# Patient Record
Sex: Female | Born: 1950 | Hispanic: No | Marital: Married | Smoking: Former smoker
Health system: Southern US, Community
[De-identification: ages and names within clinical notes are randomized; demographics above are authoritative.]

## PROBLEM LIST (undated history)

## (undated) DIAGNOSIS — T7840XA Allergy, unspecified, initial encounter: Secondary | ICD-10-CM

## (undated) DIAGNOSIS — B019 Varicella without complication: Secondary | ICD-10-CM

## (undated) DIAGNOSIS — M1990 Unspecified osteoarthritis, unspecified site: Secondary | ICD-10-CM

## (undated) DIAGNOSIS — G43909 Migraine, unspecified, not intractable, without status migrainosus: Secondary | ICD-10-CM

## (undated) HISTORY — DX: Varicella without complication: B01.9

## (undated) HISTORY — DX: Migraine, unspecified, not intractable, without status migrainosus: G43.909

## (undated) HISTORY — DX: Unspecified osteoarthritis, unspecified site: M19.90

## (undated) HISTORY — PX: APPENDECTOMY: SHX54

## (undated) HISTORY — DX: Allergy, unspecified, initial encounter: T78.40XA

---

## 2009-03-12 ENCOUNTER — Ambulatory Visit: Payer: Self-pay | Admitting: Internal Medicine

## 2009-03-26 ENCOUNTER — Ambulatory Visit: Payer: Self-pay | Admitting: Internal Medicine

## 2015-03-03 ENCOUNTER — Encounter: Payer: Self-pay | Admitting: Internal Medicine

## 2018-03-27 NOTE — Progress Notes (Signed)
Amanda Cortez D.O. [facility] [address] [place], [place] [address] Phone: [phone] Subjective:    I'm seeing this patient by the request  of:  Martha ClanShaw, William, MD   CC: Left knee and right shoulder pain  BJY:NWGNFAOZHYHPI:Subjective  Amanda Cortez is a [age] y.o. female coming in with complaint of left knee pain since 1991. She had an incident when she was playing softball and her knee buckled. Was in an immobolizer for 1 week. Continues to have problems with declines. Notes popping in the knee. Knee can lock on her on occasion. Was told that she does not have a patellar tendon reflex per her PCP denies any true swelling at this time.  States only going downhill can sometimes give worsening discomfort.  Denies any significant instability.   She also complains of rightshoulderpain. Patient feel off of a truck in 1995 and landed on her elbow on the sidewalk. States that she dislocated her clavicle. Non specific movements both her. She does Jazzercise and has had to drop weight from 5 to 2#. Pain with horizontal abduction. Most of her pain is in front of shoulder.  Patient states mild overall but does continue to give her difficulty with lifting greater than 5 pounds     Past Medical History:  Diagnosis Date  . Allergy   . Arthritis   . Chicken pox   . Migraines    Past Surgical History:  Procedure Laterality Date  . APPENDECTOMY    . CESAREAN SECTION     Social History   Socioeconomic History  . Marital status: Married    Spouse name: Not on file  . Number of children: Not on file  . Years of education: Not on file  . Highest education level: Not on file  Occupational History  . Not on file  Social Needs  . Financial resource strain: Not on file  . Food insecurity:    Worry: Not on file    Inability: Not on file  . Transportation needs:    Medical: Not on file    Non-medical: Not on file  Tobacco Use  . Smoking status: Not on file  Substance and Sexual Activity    . Alcohol use: Not on file  . Drug use: Not on file  . Sexual activity: Not on file  Lifestyle  . Physical activity:    Days per week: Not on file    Minutes per session: Not on file  . Stress: Not on file  Relationships  . Social connections:    Talks on phone: Not on file    Gets together: Not on file    Attends religious service: Not on file    Active member of club or organization: Not on file    Attends meetings of clubs or organizations: Not on file    Relationship status: Not on file  Other Topics Concern  . Not on file  Social History Narrative  . Not on file   Allergies  Allergen Reactions  . Codeine     REACTION: nausea  . Meperidine Hcl     REACTION: nausea   Family History  Problem Relation Age of Onset  . Arthritis Mother   . Hearing loss Mother   . Miscarriages / [place] Mother   . Migraines Mother   . Stroke Mother   . Hearing loss Father   . Emphysema Father   . Diabetes Maternal Grandmother   . Arthritis Maternal Grandmother   . Hypertension  Maternal Grandfather   . Arthritis Paternal Grandfather      Past medical history, social, surgical and family history all reviewed in electronic medical record.  No pertanent information unless stated regarding to the chief complaint.   Review of Systems:Review of systems updated and as accurate as of 03/28/18  No headache, visual changes, nausea, vomiting, diarrhea, constipation, dizziness, abdominal pain, skin rash, fevers, chills, night sweats, weight loss, swollen lymph nodes, body aches, joint swelling, muscle aches, chest pain, shortness of breath, mood changes.   Objective  Blood pressure 106/72, pulse 65, height 4\' 11"  (1.499 m), weight 180 lb (81.6 kg), SpO2 98 %. Systems examined below as of 03/28/18   General: No apparent distress alert and oriented x3 mood and affect normal, dressed appropriately.  HEENT: Pupils equal, extraocular movements intact  Respiratory: Patient's speak in full  sentences and does not appear short of breath  Cardiovascular: Trace lower extremity edema, non tender, no erythema  Skin: Warm dry intact with no signs of infection or rash on extremities or on axial skeleton.  Abdomen: Soft nontender  Neuro: Cranial nerves II through XII are intact, neurovascularly intact in all extremities with 2+ DTRs and 2+ pulses.  Lymph: No lymphadenopathy of posterior or anterior cervical chain or axillae bilaterally.  Gait antalgic MSK:  Non tender with full range of motion and good stability and symmetric strength and tone of  elbows, wrist, hip, and ankles bilaterally.  Shoulder: Right Inspection reveals no abnormalities, atrophy or asymmetry. The acromioclavicular joint ROM is full in all planes. Rotator cuff strength normal throughout. No signs of impingement with negative Neer and Hawkin's tests, empty can sign. Speeds and Yergason's tests normal. No labral pathology noted with negative Obrien's, negative clunk and good stability.  Positive crossover Normal scapular function observed. No painful arc and no drop arm sign. No apprehension sign  Knee: Left  Large thigh to calf ratio.  Tender to palpation over medial and PF joint line.  ROM full in flexion and extension and lower leg rotation. instability with valgus force.  No gapping in the MCL noted painful patellar compression. Patellar glide with moderate crepitus. Patellar and quadriceps tendons unremarkable. Hamstring and quadriceps strength is normal. Contralateral knee shows minimal arthritic changes  MSK US performed of: Left knee This study was ordered, performed, and interpreted by Terrilee Files D.O.  Knee: All structures visualized. Narrowing noted of the medial joint line and patient does have an MCL rupture noted that seems to be old.  LCL intact.  Lateral meniscus degenerative tearing with mild peri-meniscal cyst Patellar Tendon unremarkable on long and transverse views without  effusion.  IMPRESSION: Mild tricompartmental arthritis, MCL tear  [address]; 15 additional minutes spent for Therapeutic exercises as stated in above notes.  This included exercises focusing on stretching, strengthening, with significant focus on eccentric aspects.   Long term goals include an improvement in range of motion, strength, endurance as well as avoiding reinjury. Patient's frequency would include in 1-2 times a day, 3-5 times a week for a duration of 6-12 weeks. Shoulder Exercises that included:  Basic scapular stabilization to include adduction and depression of scapula Scaption, focusing on proper movement and good control Internal and External rotation utilizing a theraband, with elbow tucked at side entire time Rows with theraband   Proper technique shown and discussed handout in great detail with ATC.  All questions were discussed and answered.     Impression and Recommendations:     This case required medical decision  making of moderate complexity.      Note: This dictation was prepared with Dragon dictation along with smaller phrase technology. Any transcriptional errors that result from this process are unintentional.

## 2018-03-28 ENCOUNTER — Ambulatory Visit: Payer: Self-pay

## 2018-03-28 ENCOUNTER — Ambulatory Visit: Payer: Federal, State, Local not specified - PPO | Admitting: Family Medicine

## 2018-03-28 ENCOUNTER — Encounter: Payer: Self-pay | Admitting: Family Medicine

## 2018-03-28 ENCOUNTER — Ambulatory Visit (INDEPENDENT_AMBULATORY_CARE_PROVIDER_SITE_OTHER)
Admission: RE | Admit: 2018-03-28 | Discharge: 2018-03-28 | Disposition: A | Discharge status: Home / Self Care | Payer: Federal, State, Local not specified - PPO | Source: Ambulatory Visit | Attending: Family Medicine | Admitting: Family Medicine

## 2018-03-28 VITALS — BP 106/72 | HR 65 | Ht 59.0 in | Wt 180.0 lb

## 2018-03-28 DIAGNOSIS — S83412A Sprain of medial collateral ligament of left knee, initial encounter: Secondary | ICD-10-CM

## 2018-03-28 DIAGNOSIS — G8929 Other chronic pain: Secondary | ICD-10-CM | POA: Diagnosis not present

## 2018-03-28 DIAGNOSIS — M25511 Pain in right shoulder: Secondary | ICD-10-CM | POA: Diagnosis not present

## 2018-03-28 DIAGNOSIS — M25562 Pain in left knee: Secondary | ICD-10-CM

## 2018-03-28 DIAGNOSIS — M19011 Primary osteoarthritis, right shoulder: Secondary | ICD-10-CM | POA: Diagnosis not present

## 2018-03-28 NOTE — Patient Instructions (Signed)
Good to see you  [name] is yoru friend.  Keep hands within peripheral vision  Exercises 3 times a week.   Alternate the knee and the shoulder Try the brace on the knee with a lot of activity  Continue the vitamin Dand the tart cherry when you can  See me again In 4 weeks

## 2018-03-28 NOTE — Assessment & Plan Note (Signed)
Chronic.  Seems to be years old.  X-rays ordered, icing regimen, bracing, patient wants to avoid surgical intervention.  Patient's ACL appears to be intact.  Follow-up again in 4 weeks

## 2018-03-28 NOTE — Assessment & Plan Note (Signed)
Arthritis.  History of clavicle fracture.  Discussed home exercise, proper lifting technique, which activities of doing which wants to avoid.  Follow-up again in 4 weeks

## 2018-04-03 ENCOUNTER — Encounter: Payer: Self-pay | Admitting: Family Medicine

## 2018-04-27 ENCOUNTER — Ambulatory Visit: Payer: Federal, State, Local not specified - PPO | Admitting: Family Medicine

## 2018-05-21 NOTE — Progress Notes (Signed)
[facility] [address] [place], [place] [address] Phone: [phone] Subjective:   Bruce Donath, am serving as a scribe for Dr. Antoine Primas.  CC: Knee pain  LKG:[id number]  Amanda Cortez is a 67 y.o. female coming in with complaint of knee pain. Is doing better. Does have locking sensation when going down stairs. Is able to do child's pose now. Is not using a brace. Sitting for prolonged periods also causes an increase in pain.  Overall doing relatively well not stopping her from activity.  Her right shoulder is still bothering her. Pain is intermittent. She did do some exercises yesterday. No pain yesterday but is in pain today. Feels better with movement. Denies any numbness or tingling.       Past Medical History:  Diagnosis Date  . Allergy   . Arthritis   . Chicken pox   . Migraines    Past Surgical History:  Procedure Laterality Date  . APPENDECTOMY    . CESAREAN SECTION     Social History   Socioeconomic History  . Marital status: Married    Spouse name: Not on file  . Number of children: Not on file  . Years of education: Not on file  . Highest education level: Not on file  Occupational History  . Not on file  Social Needs  . Financial resource strain: Not on file  . Food insecurity:    Worry: Not on file    Inability: Not on file  . Transportation needs:    Medical: Not on file    Non-medical: Not on file  Tobacco Use  . Smoking status: Not on file  Substance and Sexual Activity  . Alcohol use: Not on file  . Drug use: Not on file  . Sexual activity: Not on file  Lifestyle  . Physical activity:    Days per week: Not on file    Minutes per session: Not on file  . Stress: Not on file  Relationships  . Social connections:    Talks on phone: Not on file    Gets together: Not on file    Attends religious service: Not on file    Active member of club or organization: Not on file    Attends meetings of clubs or  organizations: Not on file    Relationship status: Not on file  Other Topics Concern  . Not on file  Social History Narrative  . Not on file   Allergies  Allergen Reactions  . Codeine     REACTION: nausea  . Meperidine Hcl     REACTION: nausea   Family History  Problem Relation Age of Onset  . Arthritis Mother   . Hearing loss Mother   . Miscarriages / [place] Mother   . Migraines Mother   . Stroke Mother   . Hearing loss Father   . Emphysema Father   . Diabetes Maternal Grandmother   . Arthritis Maternal Grandmother   . Hypertension Maternal Grandfather   . Arthritis Paternal Grandfather    No current outpatient medications on file.    Past medical history, social, surgical and family history all reviewed in electronic medical record.  No pertanent information unless stated regarding to the chief complaint.   Review of Systems:  No headache, visual changes, nausea, vomiting, diarrhea, constipation, dizziness, abdominal pain, skin rash, fevers, chills, night sweats, weight loss, swollen lymph nodes, body aches, joint swelling, muscle aches, chest pain, shortness of breath,  mood changes.   Objective  Blood pressure 100/68, pulse 71, height 4\' 11"  (1.499 m), weight 179 lb (81.2 kg), SpO2 98 %.    General: No apparent distress alert and oriented x3 mood and affect normal, dressed appropriately.  HEENT: Pupils equal, extraocular movements intact  Respiratory: Patient's speak in full sentences and does not appear short of breath  Cardiovascular: No lower extremity edema, non tender, no erythema  Skin: Warm dry intact with no signs of infection or rash on extremities or on axial skeleton.  Abdomen: Soft nontender  Neuro: Cranial nerves II through XII are intact, neurovascularly intact in all extremities with 2+ DTRs and 2+ pulses.  Lymph: No lymphadenopathy of posterior or anterior cervical chain or axillae bilaterally.  Gait normal with good balance and coordination.    MSK:  Non tender with full range of motion and good stability and symmetric strength and tone of , elbows, wrist, hip, and ankles bilaterally.  Right shoulder exam still shows a positive crossover.  \Rotator cuff strength is intact.  Patient has some mild impingement noted.  Negative Spurling's noted.  Grip strength full and symmetric\  Left knee exam shows the patient does have good stability.  Near full range of motion.  Mild crepitus around the patella.  Mild lateral tracking noted    Impression and Recommendations:     This case required medical decision making of moderate complexity. The above documentation has been reviewed and is accurate and complete Judi Saa, DO       Note: This dictation was prepared with Dragon dictation along with smaller phrase technology. Any transcriptional errors that result from this process are unintentional.

## 2018-05-22 ENCOUNTER — Ambulatory Visit: Payer: Federal, State, Local not specified - PPO | Admitting: Family Medicine

## 2018-05-22 ENCOUNTER — Encounter: Payer: Self-pay | Admitting: Family Medicine

## 2018-05-22 DIAGNOSIS — S83412A Sprain of medial collateral ligament of left knee, initial encounter: Secondary | ICD-10-CM

## 2018-05-22 DIAGNOSIS — M1710 Unilateral primary osteoarthritis, unspecified knee: Secondary | ICD-10-CM

## 2018-05-22 DIAGNOSIS — M19011 Primary osteoarthritis, right shoulder: Secondary | ICD-10-CM | POA: Diagnosis not present

## 2018-05-22 NOTE — Assessment & Plan Note (Signed)
Patellofemoral Syndrome  Reviewed anatomy using anatomical model and how PFS occurs.  Given rehab exercises handout for VMO, hip abductors, core, entire kinetic chain including proprioception exercises including cone touches, step downs, hip elevations and turn outs.  Could benefit from PT, regular exercise, upright biking, and a PFS knee brace to assist with tracking abnormalities. RTC in 5-6 weeks

## 2018-05-22 NOTE — Assessment & Plan Note (Signed)
Significant improvement.  Patient does have a good endpoint.  I believe the patient will heal appropriately.  Patient was to do physical therapy but has declined.  We will continue with conservative therapy.  Her underlying patellofemoral arthritis likely contributing to some of the discomfort and pain still.  New exercises given.  Follow-up in 4 weeks

## 2018-05-22 NOTE — Assessment & Plan Note (Signed)
Stable.  Discussed icing regimen and home exercises.  Discussed which activities to do which wants to avoid.  Increase activity as tolerated.  Discussed different ergonomic changes.  Follow-up in 4 weeks

## 2018-05-22 NOTE — Patient Instructions (Signed)
Good to see you  Ice is yoru friend Stay active Keep hands within peripheral vison  For the knee incorporate biking and strengthen the inside muscle of the quad.  [employer] "total support" online would be great  Do not lace middle eye of the shoe See me again in 6-8 weeks

## 2018-07-13 ENCOUNTER — Ambulatory Visit: Payer: Federal, State, Local not specified - PPO | Admitting: Family Medicine

## 2018-07-13 ENCOUNTER — Encounter: Payer: Self-pay | Admitting: Family Medicine

## 2018-07-13 DIAGNOSIS — M1710 Unilateral primary osteoarthritis, unspecified knee: Secondary | ICD-10-CM

## 2018-07-13 DIAGNOSIS — M19011 Primary osteoarthritis, right shoulder: Secondary | ICD-10-CM

## 2018-07-13 NOTE — Assessment & Plan Note (Signed)
Much better at this time.  No change in management.  Follow-up as needed

## 2018-07-13 NOTE — Assessment & Plan Note (Signed)
Also having improvement.  Discussed icing regimen and home exercise.  Discussed keeping hands within the peripheral vision.  Worsening symptoms consider injection.  Patient will follow-up as needed

## 2018-07-13 NOTE — Progress Notes (Signed)
Amanda Cortez D.O. [facility] [address] [place], [name] Subjective:    I Amanda NighKana Cortez am serving as a Neurosurgeonscribe for Dr. Antoine PrimasZachary Elohim Cortez.   I'm seeing this patient by the request  of:    CC: Left knee pain, right shoulder pain follow-up  BJY:NWGNFAOZHYHPI:Subjective  Amanda Cortez is a 67 y.o. female coming in with complaint of left knee pain. States the knee is feeling much better. Shoulder as well.  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  Patient states that only crossing over her body gives her some discomfort and pain.  Sometimes sleeping on it is uncomfortable but does not keep her up at night.  Patient denies any radiation of the arm or any numbness or tingling.     Past Medical History:  Diagnosis Date  . Allergy   . Arthritis   . Chicken pox   . Migraines    Past Surgical History:  Procedure Laterality Date  . APPENDECTOMY    . CESAREAN SECTION     Social History   Socioeconomic History  . Marital status: Married    Spouse name: Not on file  . Number of children: Not on file  . Years of education: Not on file  . Highest education level: Not on file  Occupational History  . Not on file  Social Needs  . Financial resource strain: Not on file  . Food insecurity:    Worry: Not on file    Inability: Not on file  . Transportation needs:    Medical: Not on file    Non-medical: Not on file  Tobacco Use  . Smoking status: Former Smoker    Last attempt to quit: 08/24/2011    Years since quitting: 6.8  . Smokeless tobacco: Never Used  Substance and Sexual Activity  . Alcohol use: Not on file  . Drug use: Not on file  . Sexual activity: Not on file  Lifestyle  . Physical activity:    Days per week: Not on file    Minutes per session: Not on file  . Stress: Not on file  Relationships  . Social connections:    Talks on phone: Not on file    Gets together: Not on file    Attends religious  service: Not on file    Active member of club or organization: Not on file    Attends meetings of clubs or organizations: Not on file    Relationship status: Not on file  Other Topics Concern  . Not on file  Social History Narrative  . Not on file   Allergies  Allergen Reactions  . Codeine     REACTION: nausea  . Meperidine Hcl     REACTION: nausea   Family History  Problem Relation Age of Onset  . Arthritis Mother   . Hearing loss Mother   . Miscarriages / [place] Mother   . Migraines Mother   . Stroke Mother   . Hearing loss Father   . Emphysema Father   . Diabetes Maternal Grandmother   . Arthritis Maternal Grandmother   . Hypertension Maternal Grandfather   . Arthritis Paternal Grandfather          Current Outpatient Medications (Other):  Marland Kitchen.  Astaxanthin 5 MG CAPS, Take by mouth. 12mg  1 time a day    Past medical history, social, surgical and family history all reviewed in electronic medical record.  No pertanent information unless stated regarding  to the chief complaint.   Review of Systems:  No headache, visual changes, nausea, vomiting, diarrhea, constipation, dizziness, abdominal pain, skin rash, fevers, chills, night sweats, weight loss, swollen lymph nodes, body aches, joint swelling, muscle aches, chest pain, shortness of breath, mood changes.   Objective  Blood pressure 130/70, pulse 67, height 4\' 11"  (1.499 m), weight 177 lb (80.3 kg), SpO2 97 %. Systems examined below as of    General: No apparent distress alert and oriented x3 mood and affect normal, dressed appropriately.  HEENT: Pupils equal, extraocular movements intact  Respiratory: Patient's speak in full sentences and does not appear short of breath  Cardiovascular: No lower extremity edema, non tender, no erythema  Skin: Warm dry intact with no signs of infection or rash on extremities or on axial skeleton.  Abdomen: Soft nontender  Neuro: Cranial nerves II through XII are intact,  neurovascularly intact in all extremities with 2+ DTRs and 2+ pulses.  Lymph: No lymphadenopathy of posterior or anterior cervical chain or axillae bilaterally.  Gait normal with good balance and coordination.  MSK:  Non tender with full range of motion and good stability and symmetric strength and tone of shoulders, elbows, wrist, hip, knee and ankles bilaterally.  Knee exam shows some mild osteophytic changes but no significant instability.  Patient does have some tenderness to palpation diffusely.  Mild crepitus.  Negative grind test.  Full range of motion.  Neurovascularly intact distally with 5 out of 5 strength  Right shoulder exam does have some impingement and does have a positive crossover test. Mildly tender over the acromioclavicular joint.  Otherwise unremarkable    Impression and Recommendations:     This case required medical decision making of moderate complexity. The above documentation has been reviewed and is accurate and complete Amanda Saa, DO       Note: This dictation was prepared with Dragon dictation along with smaller phrase technology. Any transcriptional errors that result from this process are unintentional.

## 2018-07-13 NOTE — Patient Instructions (Signed)
Good to see you  You will do great  Keep it up  No changes  Enjoy your trip If increasing weights ever please decrease repetitionis.  See em agai nin 2 months if not perfect

## 2018-09-14 ENCOUNTER — Ambulatory Visit: Payer: Federal, State, Local not specified - PPO | Admitting: Family Medicine

## 2018-09-14 ENCOUNTER — Encounter: Payer: Self-pay | Admitting: Family Medicine

## 2018-09-14 DIAGNOSIS — M19011 Primary osteoarthritis, right shoulder: Secondary | ICD-10-CM

## 2018-09-14 DIAGNOSIS — S83412A Sprain of medial collateral ligament of left knee, initial encounter: Secondary | ICD-10-CM | POA: Diagnosis not present

## 2018-09-14 NOTE — Assessment & Plan Note (Signed)
Stable.  Any worsening symptoms consider injection.  Continue the home exercises and icing regimen otherwise.  Follow-up as needed

## 2018-09-14 NOTE — Assessment & Plan Note (Signed)
Fully healed with no concern for any long-term injury.  Does have some patellofemoral arthritis and will monitor. This patient is well follow-up as needed

## 2018-09-14 NOTE — Patient Instructions (Signed)
Good to see you  [name] is your friend Keep hands within peripheral vision  See me again when you need me [phone]

## 2018-09-14 NOTE — Progress Notes (Signed)
[facility] [address] [place], [place] [address] Phone: [phone] Subjective:   Amanda Cortez, am serving as a scribe for Dr. Antoine Primas.   CC: Shoulder pain and knee pain follow-up  LFY:[id number]  Amanda Cortez is a 68 y.o. female coming in with complaint of left knee pain and AC joint arthritis, right shoulder. Patient states that her knee is doing much better. She did some traveling to [place] and did not have trouble. Does still have some pain in the [facility] joint but it has improved. Is managing her pain with conservative therapy. Doing exercises and using ice.     Past Medical History:  Diagnosis Date  . Allergy   . Arthritis   . Chicken pox   . Migraines    Past Surgical History:  Procedure Laterality Date  . APPENDECTOMY    . CESAREAN SECTION     Social History   Socioeconomic History  . Marital status: Married    Spouse name: Not on file  . Number of children: Not on file  . Years of education: Not on file  . Highest education level: Not on file  Occupational History  . Not on file  Social Needs  . Financial resource strain: Not on file  . Food insecurity:    Worry: Not on file    Inability: Not on file  . Transportation needs:    Medical: Not on file    Non-medical: Not on file  Tobacco Use  . Smoking status: Former Smoker    Last attempt to quit: 08/24/2011    Years since quitting: 7.0  . Smokeless tobacco: Never Used  Substance and Sexual Activity  . Alcohol use: Not on file  . Drug use: Not on file  . Sexual activity: Not on file  Lifestyle  . Physical activity:    Days per week: Not on file    Minutes per session: Not on file  . Stress: Not on file  Relationships  . Social connections:    Talks on phone: Not on file    Gets together: Not on file    Attends religious service: Not on file    Active member of club or organization: Not on file    Attends meetings of clubs or organizations: Not on file    Relationship status: Not on file  Other Topics Concern  . Not on file  Social History Narrative  . Not on file   Allergies  Allergen Reactions  . Codeine     REACTION: nausea  . Meperidine Hcl     REACTION: nausea   Family History  Problem Relation Age of Onset  . Arthritis Mother   . Hearing loss Mother   . Miscarriages / [place] Mother   . Migraines Mother   . Stroke Mother   . Hearing loss Father   . Emphysema Father   . Diabetes Maternal Grandmother   . Arthritis Maternal Grandmother   . Hypertension Maternal Grandfather   . Arthritis Paternal Grandfather          Current Outpatient Medications (Other):  Marland Kitchen  Astaxanthin 5 MG CAPS, Take by mouth. 12mg  1 time a day    Past medical history, social, surgical and family history all reviewed in electronic medical record.  No pertanent information unless stated regarding to the chief complaint.   Review of Systems:  No headache, visual changes, nausea, vomiting, diarrhea, constipation, dizziness, abdominal pain, skin rash, fevers, chills, night sweats,  weight loss, swollen lymph nodes, body aches, joint swelling, muscle aches, chest pain, shortness of breath, mood changes.   Objective  There were no vitals taken for this visit. Systems examined below as of    General: No apparent distress alert and oriented x3 mood and affect normal, dressed appropriately.  HEENT: Pupils equal, extraocular movements intact  Respiratory: Patient's speak in full sentences and does not appear short of breath  Cardiovascular: No lower extremity edema, non tender, no erythema  Skin: Warm dry intact with no signs of infection or rash on extremities or on axial skeleton.  Abdomen: Soft nontender  Neuro: Cranial nerves II through XII are intact, neurovascularly intact in all extremities with 2+ DTRs and 2+ pulses.  Lymph: No lymphadenopathy of posterior or anterior cervical chain or axillae bilaterally.  Gait normal with good balance  and coordination.  MSK:  Non tender with full range of motion and good stability and symmetric strength and tone of shoulders, elbows, wrist, hip, and ankles bilaterally.  Left knee exam is completely unremarkable.  LCL intact.  Full range of motion.  Patient's right shoulder still has some positive crossover sign but otherwise unremarkable.  5 out of 5 strength of rotator cuff    Impression and Recommendations:     The above documentation has been reviewed and is accurate and complete Judi Saa, DO       Note: This dictation was prepared with Dragon dictation along with smaller phrase technology. Any transcriptional errors that result from this process are unintentional.

## 2019-04-02 ENCOUNTER — Encounter: Payer: Self-pay | Admitting: Internal Medicine

## 2019-09-26 IMAGING — DX DG SHOULDER 2+V*R*
3 series · 3 of 3 positions shown · non-contrast
Comparison: None.

CLINICAL DATA: 66-year-old female with chronic discomfort. States
"popped her right clavicle out in the past" . Initial encounter.

EXAM:
RIGHT SHOULDER - 2+ VIEW

[grashey]
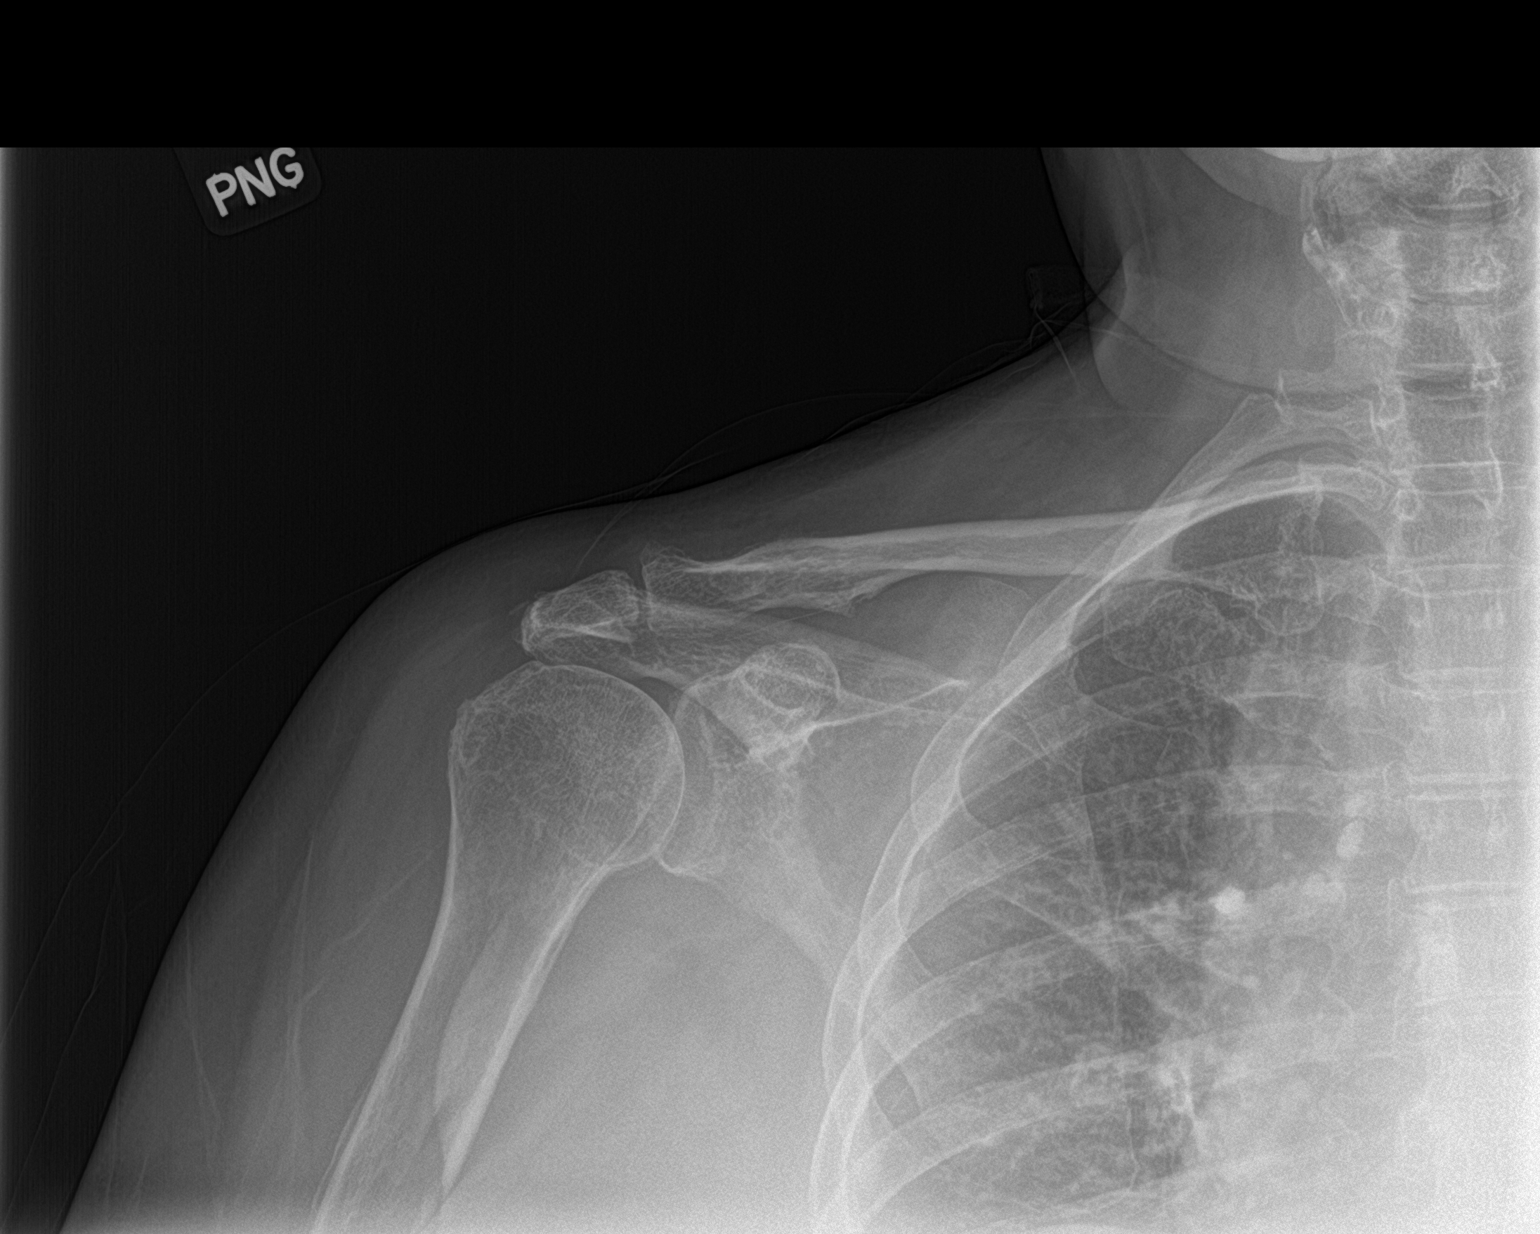

[y view]
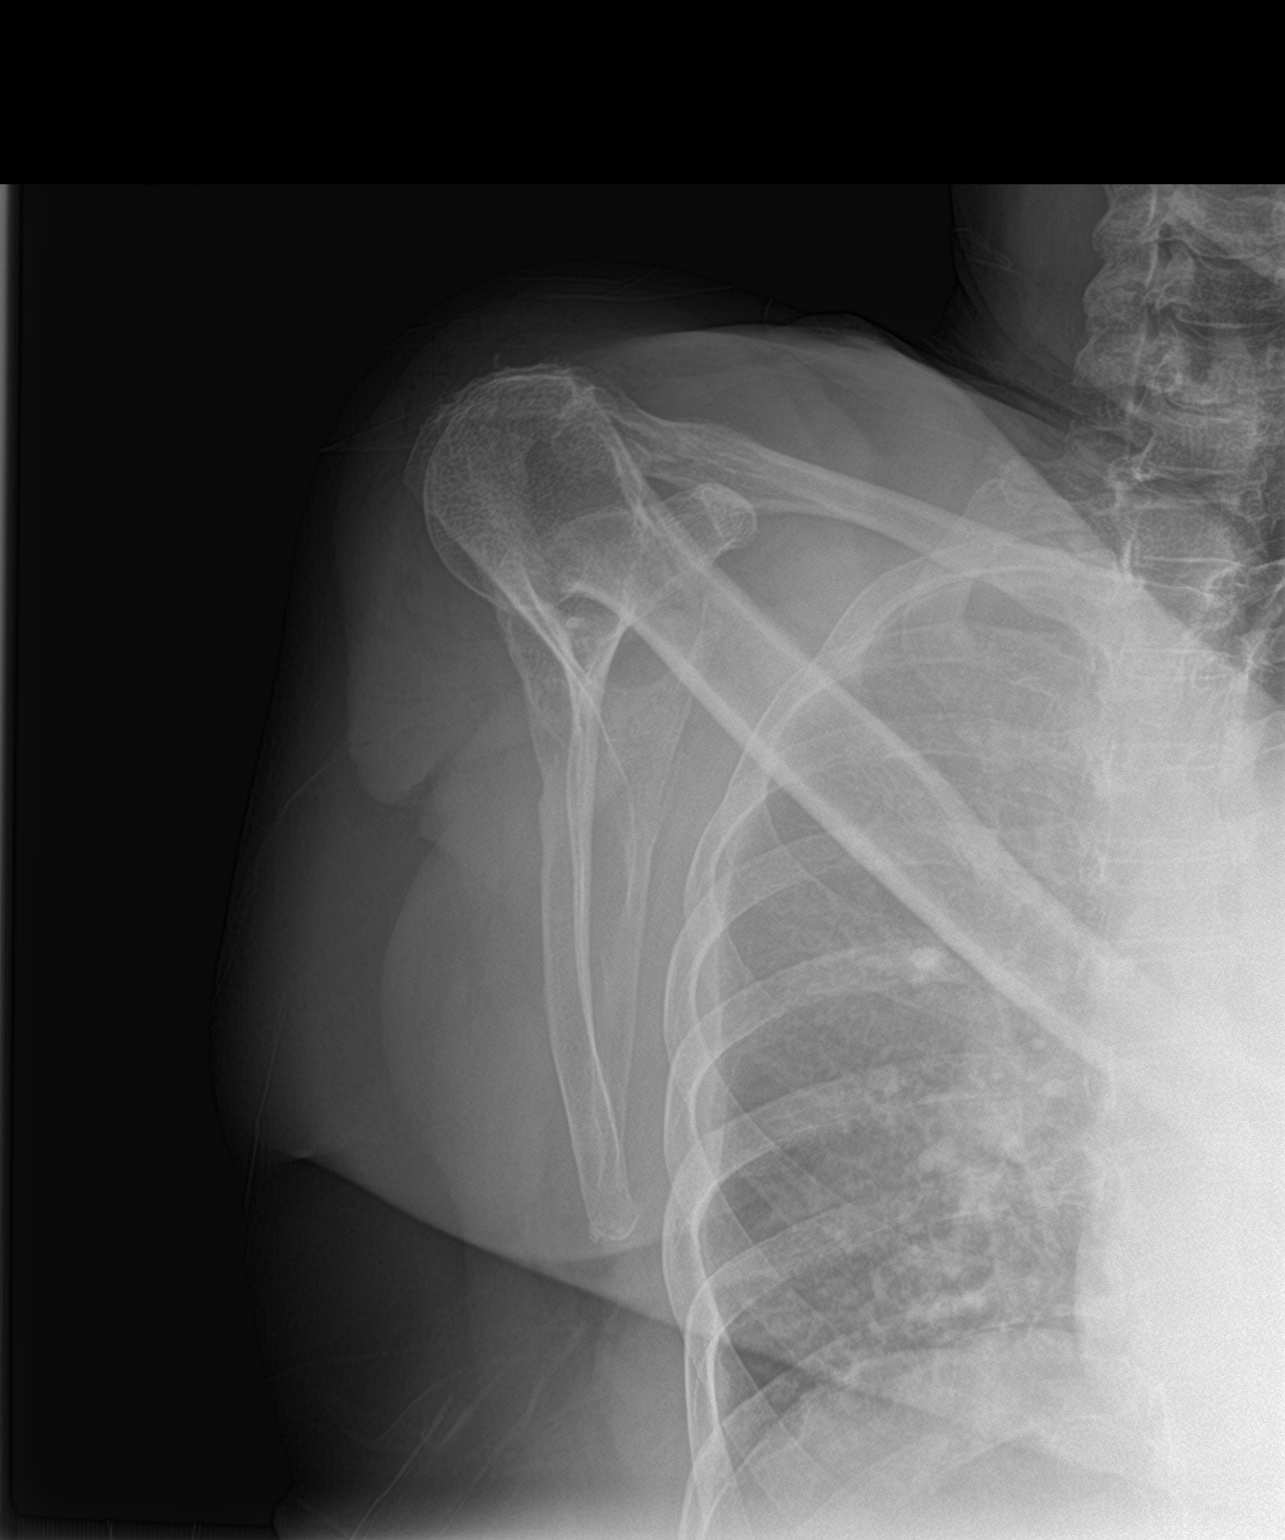

[shoulder axial]
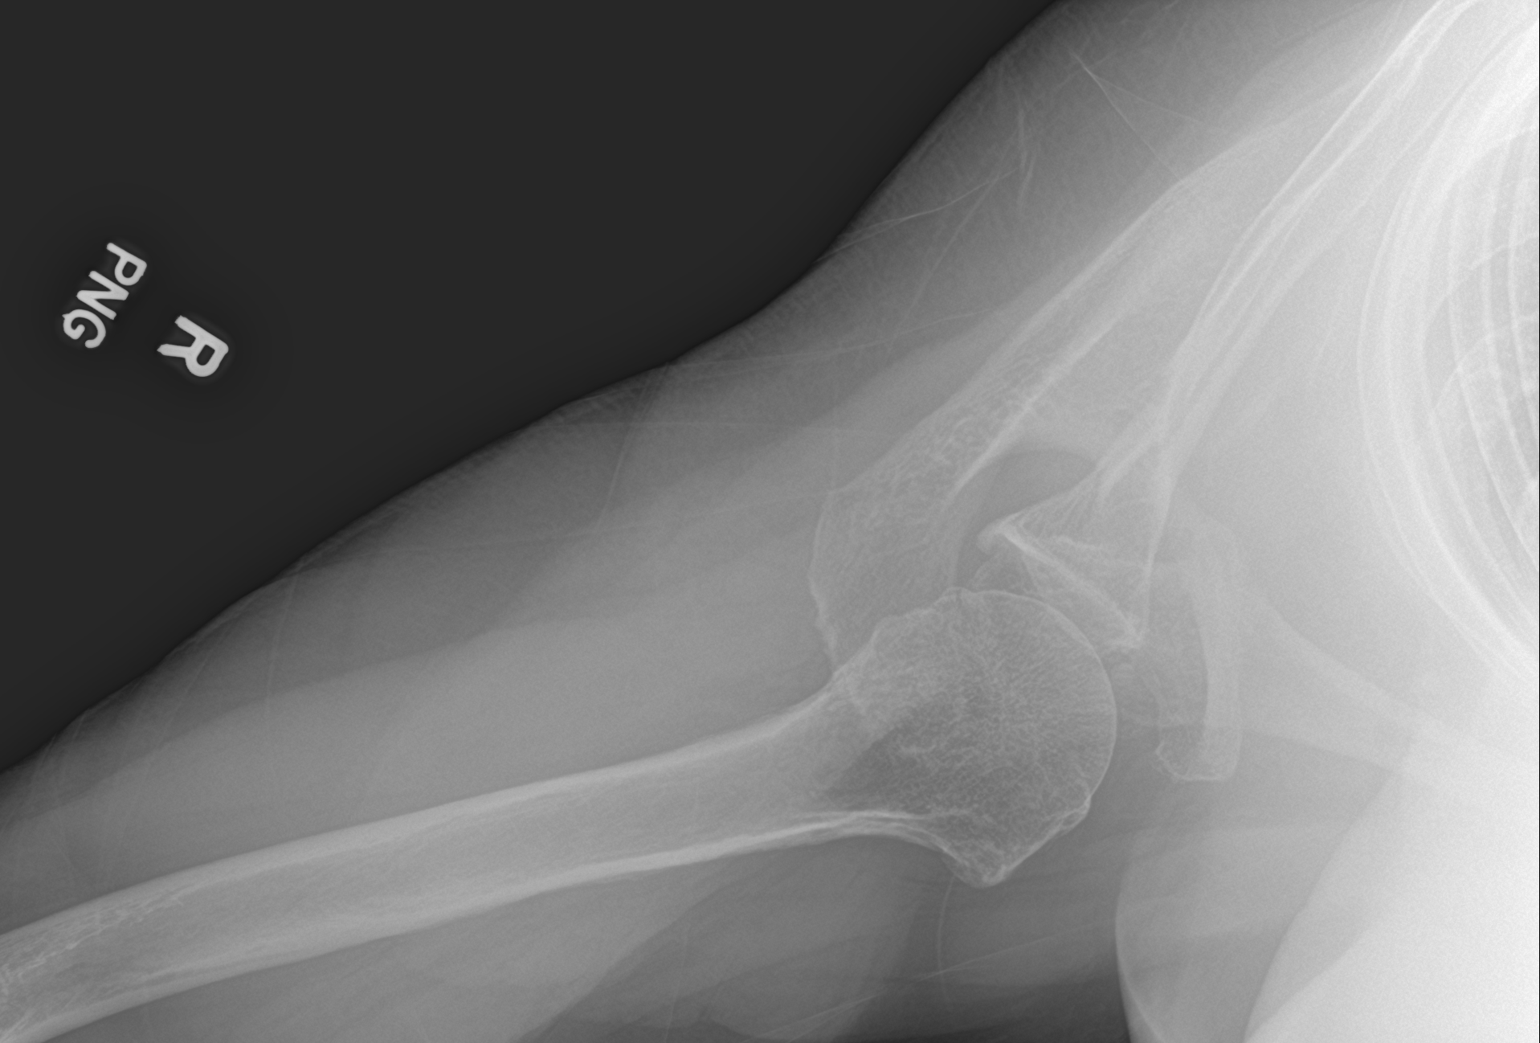

[3 of 3 positions shown; findings below may reference images not displayed]

FINDINGS: Mild to moderate acromioclavicular joint degenerative changes.
Slight spurring undersurface of the acromion. No fracture or
dislocation. No abnormal soft tissue calcifications.

Possible calcified right hilar lymph nodes.
IMPRESSION: Mild to moderate right acromioclavicular joint degenerative changes.
